# Patient Record
Sex: Female | Born: 1990 | Race: Black or African American | Hispanic: No | Marital: Single | Smoking: Current every day smoker
Health system: Southern US, Community
[De-identification: ages and names within clinical notes are randomized; demographics above are authoritative.]

## PROBLEM LIST (undated history)

## (undated) DIAGNOSIS — D649 Anemia, unspecified: Secondary | ICD-10-CM

---

## 2013-11-10 ENCOUNTER — Emergency Department (HOSPITAL_BASED_OUTPATIENT_CLINIC_OR_DEPARTMENT_OTHER): Payer: Self-pay

## 2013-11-10 ENCOUNTER — Emergency Department (HOSPITAL_BASED_OUTPATIENT_CLINIC_OR_DEPARTMENT_OTHER)
Admission: EM | Admit: 2013-11-10 | Discharge: 2013-11-11 | Disposition: A | Discharge status: Home / Self Care | Payer: Self-pay | Attending: Emergency Medicine | Admitting: Emergency Medicine

## 2013-11-10 ENCOUNTER — Encounter (HOSPITAL_BASED_OUTPATIENT_CLINIC_OR_DEPARTMENT_OTHER): Payer: Self-pay | Admitting: Emergency Medicine

## 2013-11-10 DIAGNOSIS — J159 Unspecified bacterial pneumonia: Secondary | ICD-10-CM | POA: Insufficient documentation

## 2013-11-10 DIAGNOSIS — R0789 Other chest pain: Secondary | ICD-10-CM

## 2013-11-10 DIAGNOSIS — Z862 Personal history of diseases of the blood and blood-forming organs and certain disorders involving the immune mechanism: Secondary | ICD-10-CM | POA: Insufficient documentation

## 2013-11-10 DIAGNOSIS — F17200 Nicotine dependence, unspecified, uncomplicated: Secondary | ICD-10-CM | POA: Insufficient documentation

## 2013-11-10 DIAGNOSIS — J189 Pneumonia, unspecified organism: Secondary | ICD-10-CM

## 2013-11-10 DIAGNOSIS — Z3202 Encounter for pregnancy test, result negative: Secondary | ICD-10-CM | POA: Insufficient documentation

## 2013-11-10 HISTORY — DX: Anemia, unspecified: D64.9

## 2013-11-10 MED ORDER — HYDROCODONE-ACETAMINOPHEN 5-325 MG PO TABS
1.0000 | ORAL_TABLET | Freq: Once | ORAL | Status: AC
  Administered 2013-11-10: 1 via ORAL
  Filled 2013-11-10: qty 1

## 2013-11-10 MED ORDER — NAPROXEN 250 MG PO TABS
500.0000 mg | ORAL_TABLET | Freq: Once | ORAL | Status: AC
  Administered 2013-11-10: 500 mg via ORAL
  Filled 2013-11-10: qty 2

## 2013-11-10 NOTE — ED Notes (Signed)
Pt complains of nasal congestion, shortness of breath, fever, cough, chest pain on inspiration since last week.

## 2013-11-10 NOTE — ED Provider Notes (Signed)
CSN: [id number]     Arrival date & time 11/10/13  2201 History  This chart was scribed for Hanley Seamen, MD by Danella Maiers, ED Scribe. This patient was seen in room MH06/MH06 and the patient's care was started at 11:25 PM.    Chief Complaint  Patient presents with  . URI   The history is provided by the patient. No language interpreter was used.   HPI Comments: Moana Munford is a 23 y.o. female who presents to the Emergency Department complaining of sharp left-sided chest pain under the left breast onset 3 hours ago. She states the pain worsens when she takes deep breaths, coughs, or sneezes. She rates it a 10/10. Pt also reports URI symptoms over the last week including nasal congestion, SOB, subjective fever and non-productive cough. She states all of her URI symptoms are improving except for the cough. She denies vomiting or diarrhea.   Past Medical History  Diagnosis Date  . Anemia    History reviewed. No pertinent past surgical history. History reviewed. No pertinent family history. History  Substance Use Topics  . Smoking status: Current Every Day Smoker  . Smokeless tobacco: Never Used  . Alcohol Use: No   OB History   Grav Para Term Preterm Abortions TAB SAB Ect Mult Living                 Review of Systems  Constitutional: Positive for fever.  HENT: Positive for congestion.   Respiratory: Positive for cough and shortness of breath.   Cardiovascular: Positive for chest pain.  Gastrointestinal: Negative for vomiting and diarrhea.   A complete 10 system review of systems was obtained and all systems are negative except as noted in the HPI and PMH.   Allergies  Review of patient's allergies indicates no known allergies.  Home Medications  No current outpatient prescriptions on file. BP 122/61  Pulse 90  Temp(Src) 98.7 F (37.1 C) (Oral)  Resp 22  Ht 5\' 11"  (1.803 m)  Wt 174 lb (78.926 kg)  BMI 24.28 kg/m2  SpO2 100%  LMP 11/02/2013  Physical Exam General:  Well-developed, well-nourished female in no acute distress; appearance consistent with age of record HENT: normocephalic; atraumatic. TMs normal. Throat normal.  Eyes: pupils equal, round and reactive to light; extraocular muscles intact Neck: supple Heart: regular rate and rhythm; no murmurs, rubs or gallops Lungs: clear to auscultation bilaterally. Left parasternal tenderness. Lungs are clear but shallow breathing due to pain.  Abdomen: soft; nondistended; nontender; no masses or hepatosplenomegaly; bowel sounds present.  Extremities: No deformity; full range of motion; pulses normal; no edema.  Neurologic: Awake, alert and oriented; motor function intact in all extremities and symmetric; no facial droop Skin: Warm and dry Psychiatric: Normal mood and affect  ED Course  Procedures (including critical care time) DIAGNOSTIC STUDIES: Oxygen Saturation is 100% on RA, normal by my interpretation.    COORDINATION OF CARE: 11:30 PM- Discussed treatment plan with pt which includes CXR. Pt agrees to plan.    MDM   Nursing notes and vitals signs, including pulse oximetry, reviewed.  Summary of this visit's results, reviewed by myself:  Imaging Studies: Dg Chest 2 View  11/11/2013   CLINICAL DATA:  Left-sided chest pain, worse with deep inspiration.  EXAM: CHEST  2 VIEW  COMPARISON:  None.  FINDINGS: The patient has bibasilar airspace disease with an appearance most worrisome for pneumonia. No pneumothorax or pleural fluid is identified. Heart size is normal.  IMPRESSION: Bibasilar  airspace disease worrisome for pneumonia.   Electronically Signed   By: Drusilla Kannerhomas  Dalessio M.D.   On: 11/11/2013 00:12   2:12 AM She given Rocephin 1 g IV. We will discharge her home on doxycycline. She was advised to return should symptoms worsen.  I personally performed the services described in this documentation, which was scribed in my presence.  The recorded information has been reviewed and is  accurate.   Hanley SeamenJohn L Lyanna Blystone, MD 11/11/13 [phone]

## 2013-11-11 LAB — PREGNANCY, URINE: PREG TEST UR: NEGATIVE

## 2013-11-11 MED ORDER — DOXYCYCLINE HYCLATE 100 MG PO TABS
100.0000 mg | ORAL_TABLET | Freq: Once | ORAL | Status: AC
  Administered 2013-11-11: 100 mg via ORAL

## 2013-11-11 MED ORDER — HYDROCODONE-ACETAMINOPHEN 5-325 MG PO TABS: 1.0000 | ORAL_TABLET | Freq: Four times a day (QID) | ORAL | Status: DC | PRN

## 2013-11-11 MED ORDER — DOXYCYCLINE HYCLATE 100 MG PO CAPS: ORAL_CAPSULE | ORAL | Status: DC

## 2013-11-11 MED ORDER — DEXTROSE 5 % IV SOLN
1.0000 g | Freq: Once | INTRAVENOUS | Status: AC
  Administered 2013-11-11: 1 g via INTRAVENOUS

## 2013-11-11 MED ORDER — SODIUM CHLORIDE 0.9 % IV SOLN
INTRAVENOUS | Status: DC
  Administered 2013-11-11: 01:00:00 via INTRAVENOUS

## 2013-11-11 MED ORDER — CEFTRIAXONE SODIUM 1 G IJ SOLR
INTRAMUSCULAR | Status: AC
  Filled 2013-11-11: qty 10

## 2013-11-11 MED ORDER — DOXYCYCLINE HYCLATE 100 MG PO TABS
ORAL_TABLET | ORAL | Status: AC
  Administered 2013-11-11: 03:00:00 100 mg via ORAL
  Filled 2013-11-11: qty 1

## 2013-11-11 NOTE — ED Notes (Signed)
Patient called requesting new RX for antibiotic called in due to expense of doxycycline. Spoke with Dr. Jodi MourningZavitz and z-pack called to [facility] [address]

## 2013-11-11 NOTE — ED Notes (Signed)
No adverse effects noted to IV abt infusion.

## 2015-08-19 ENCOUNTER — Encounter (HOSPITAL_BASED_OUTPATIENT_CLINIC_OR_DEPARTMENT_OTHER): Payer: Self-pay | Admitting: Emergency Medicine

## 2015-08-19 ENCOUNTER — Emergency Department (HOSPITAL_BASED_OUTPATIENT_CLINIC_OR_DEPARTMENT_OTHER)
Admission: EM | Admit: 2015-08-19 | Discharge: 2015-08-19 | Disposition: A | Discharge status: Home / Self Care | Payer: Medicaid Other | Attending: Emergency Medicine | Admitting: Emergency Medicine

## 2015-08-19 DIAGNOSIS — B360 Pityriasis versicolor: Secondary | ICD-10-CM | POA: Insufficient documentation

## 2015-08-19 DIAGNOSIS — Z862 Personal history of diseases of the blood and blood-forming organs and certain disorders involving the immune mechanism: Secondary | ICD-10-CM | POA: Insufficient documentation

## 2015-08-19 DIAGNOSIS — Z720 Tobacco use: Secondary | ICD-10-CM | POA: Insufficient documentation

## 2015-08-19 DIAGNOSIS — R21 Rash and other nonspecific skin eruption: Secondary | ICD-10-CM | POA: Diagnosis present

## 2015-08-19 MED ORDER — FLUCONAZOLE 200 MG PO TABS: 200.0000 mg | ORAL_TABLET | Freq: Every day | ORAL | Status: AC

## 2015-08-19 MED ORDER — KETOCONAZOLE 2 % EX SHAM: 1.0000 "application " | MEDICATED_SHAMPOO | CUTANEOUS | Status: AC

## 2015-08-19 NOTE — Discharge Instructions (Signed)
Tinea Versicolor Tinea versicolor is a common fungal infection of the skin. It causes a rash that appears as light or dark patches on the skin. The rash most often occurs on the chest, back, neck, or upper arms. This condition is more common during warm weather. Other than affecting how your skin looks, tinea versicolor usually does not cause other problems. In most cases, the infection goes away in a few weeks with treatment. It may take a few months for the patches on your skin to clear up. CAUSES Tinea versicolor occurs when a type of fungus that is normally present on the skin starts to overgrow. This fungus is a kind of yeast. The exact cause of the overgrowth is not known. This condition cannot be passed from one person to another (noncontagious). RISK FACTORS This condition is more likely to develop when certain factors are present, such as:  Heat and humidity.  Sweating too much.  Hormone changes.  Oily skin.  A weak defense (immune) system. SYMPTOMS Symptoms of this condition may include:  A rash on your skin that is made up of light or dark patches. The rash may have:  Patches of tan or pink spots on light skin.  Patches of white or brown spots on dark skin.  Patches of skin that do not tan.  Well-marked edges.  Scales on the discolored areas.  Mild itching. DIAGNOSIS A health care provider can usually diagnose this condition by looking at your skin. During the exam, he or she may use ultraviolet light to help determine the extent of the infection. In some cases, a skin sample may be taken by scraping the rash. This sample will be viewed under a microscope to check for yeast overgrowth. TREATMENT Treatment for this condition may include:  Dandruff shampoo that is applied to the affected skin during showers or bathing.  Over-the-counter medicated skin cream, lotion, or soaps.  Prescription antifungal medicine in the form of skin cream or pills.  Medicine to help  reduce itching. HOME CARE INSTRUCTIONS  Take medicines only as directed by your health care provider.  Apply dandruff shampoo to the affected area if told to do so by your health care provider. You may be instructed to scrub the affected skin for several minutes each day.  Do not scratch the affected area of skin.  Avoid hot and humid conditions.  Do not use tanning booths.  Try to avoid sweating a lot. SEEK MEDICAL CARE IF:  Your symptoms get worse.  You have a fever.  You have redness, swelling, or pain at the site of your rash.  You have fluid, blood, or pus coming from your rash.  Your rash returns after treatment.   This information is not intended to replace advice given to you by your health care provider. Make sure you discuss any questions you have with your health care provider.   Document Released: 10/16/2000 Document Revised: 11/09/2014 Document Reviewed: 07/31/2014 Elsevier Interactive Patient Education 2016 [employer]  

## 2015-08-19 NOTE — ED Provider Notes (Signed)
CSN: [id number]     Arrival date & time 08/19/15  0905 History   First MD Initiated Contact with Patient 08/19/15 [phone]     Chief Complaint  Patient presents with  . Rash     (Consider location/radiation/quality/duration/timing/severity/associated sxs/prior Treatment) Patient is a 24 y.o. female presenting with rash. The history is provided by the patient.  Rash Location:  Torso, shoulder/arm and head/neck Head/neck rash location:  L neck and R neck Shoulder/arm rash location:  L upper arm, R upper arm, L forearm and R forearm Torso rash location:  L chest, R chest, upper back, L axilla and R axilla (bilateral groin) Quality: dryness, itchiness and scaling   Quality: not painful   Severity:  Severe Onset quality:  Gradual Duration:  3 weeks Timing:  Constant Progression:  Worsening Chronicity:  New Context: not new detergent/soap and not sick contacts   Context comment:  Seen by [facility] proximally 2 weeks ago and was given steroids. After taking 4 days of steroid cigarettes got much worse Relieved by:  Nothing Exacerbated by: Steroids. Ineffective treatments:  None tried Associated symptoms: no abdominal pain, no myalgias, no nausea, no periorbital edema, no throat swelling, no tongue swelling and no URI     Past Medical History  Diagnosis Date  . Anemia    No past surgical history on file. No family history on file. Social History  Substance Use Topics  . Smoking status: Current Every Day Smoker  . Smokeless tobacco: Never Used  . Alcohol Use: No   OB History    No data available     Review of Systems  Gastrointestinal: Negative for nausea and abdominal pain.  Musculoskeletal: Negative for myalgias.  Skin: Positive for rash.  All other systems reviewed and are negative.     Allergies  Review of patient's allergies indicates no known allergies.  Home Medications   Prior to Admission medications   Medication Sig Start Date End Date Taking?  Authorizing Provider  fluconazole (DIFLUCAN) 200 MG tablet Take 1 tablet (200 mg total) by mouth daily. 08/19/15 08/26/15  Gwyneth SproutWhitney Cheyne Boulden, MD  ketoconazole (NIZORAL) 2 % shampoo Apply 1 application topically 2 (two) times a week. 08/19/15   Gwyneth SproutWhitney Hernandez Losasso, MD   BP 122/78 mmHg  Pulse 88  Temp(Src) 97.8 F (36.6 C) (Oral)  Resp 18  Ht 5' 11.5" (1.816 m)  Wt 183 lb (83.008 kg)  BMI 25.17 kg/m2  SpO2 100% Physical Exam  Constitutional: She is oriented to person, place, and time. She appears well-developed and well-nourished. No distress.  HENT:  Head: Normocephalic and atraumatic.  Mouth/Throat: Oropharynx is clear and moist.  Eyes: Conjunctivae and EOM are normal. Pupils are equal, round, and reactive to light.  Neck: Normal range of motion. Neck supple.  Cardiovascular: Normal rate.   Pulmonary/Chest: Effort normal.  Abdominal: Soft.  Musculoskeletal: Normal range of motion. She exhibits no edema or tenderness.  Neurological: She is alert and oriented to person, place, and time.  Skin: Skin is warm and dry. Rash noted. Rash is macular. No erythema.  Confluence rash most concentrated on the upper torso, upper extremities and groin. Circular raised dry patches with central clearing. Excoriated skin and dryness. No pustules or vesicular lesions no involvement of the lower legs or hands. No face involvement  Psychiatric: She has a normal mood and affect. Her behavior is normal.  Nursing note and vitals reviewed.   ED Course  Procedures (including critical care time) Labs Review Labs Reviewed -  No data to display  Imaging Review No results found. I have personally reviewed and evaluated these images and lab results as part of my medical decision-making.   EKG Interpretation None      MDM   Final diagnoses:  Tinea versicolor    Patient with a rash mostly involving the chest neck and upper back as well as upper extremities and groin most consistent with tinea. Patient  states that started 3 weeks ago when she was initially treated with prednisone which made her rash much worse causing her to stop the prednisone after 4 days.  Patient treated with heat, as all shampoo and Diflucan. Instructed to follow-up with dermatology if symptoms do not improve    Gwyneth Sprout, MD 08/19/15 1250

## 2015-08-19 NOTE — ED Notes (Signed)
Pt has generalized rash for two weeks.  Pt states it itches and burns.    Pt believed it was due to tide washing detergent.  Pt went to [facility] and was given steroids but the rash has worsened.   Pt states she has stopped all the detergents and soaps that were new.  Rash is flat, some redness and white areas noted.  Pt has tried several OTC ointments but nothing has helped.

## 2015-10-12 IMAGING — CR DG CHEST 2V
3 series · 3 of 3 positions shown · non-contrast
Comparison: None.

CLINICAL DATA: Left-sided chest pain, worse with deep inspiration.

EXAM:
CHEST  2 VIEW

[w chest pa (1 of 2)]
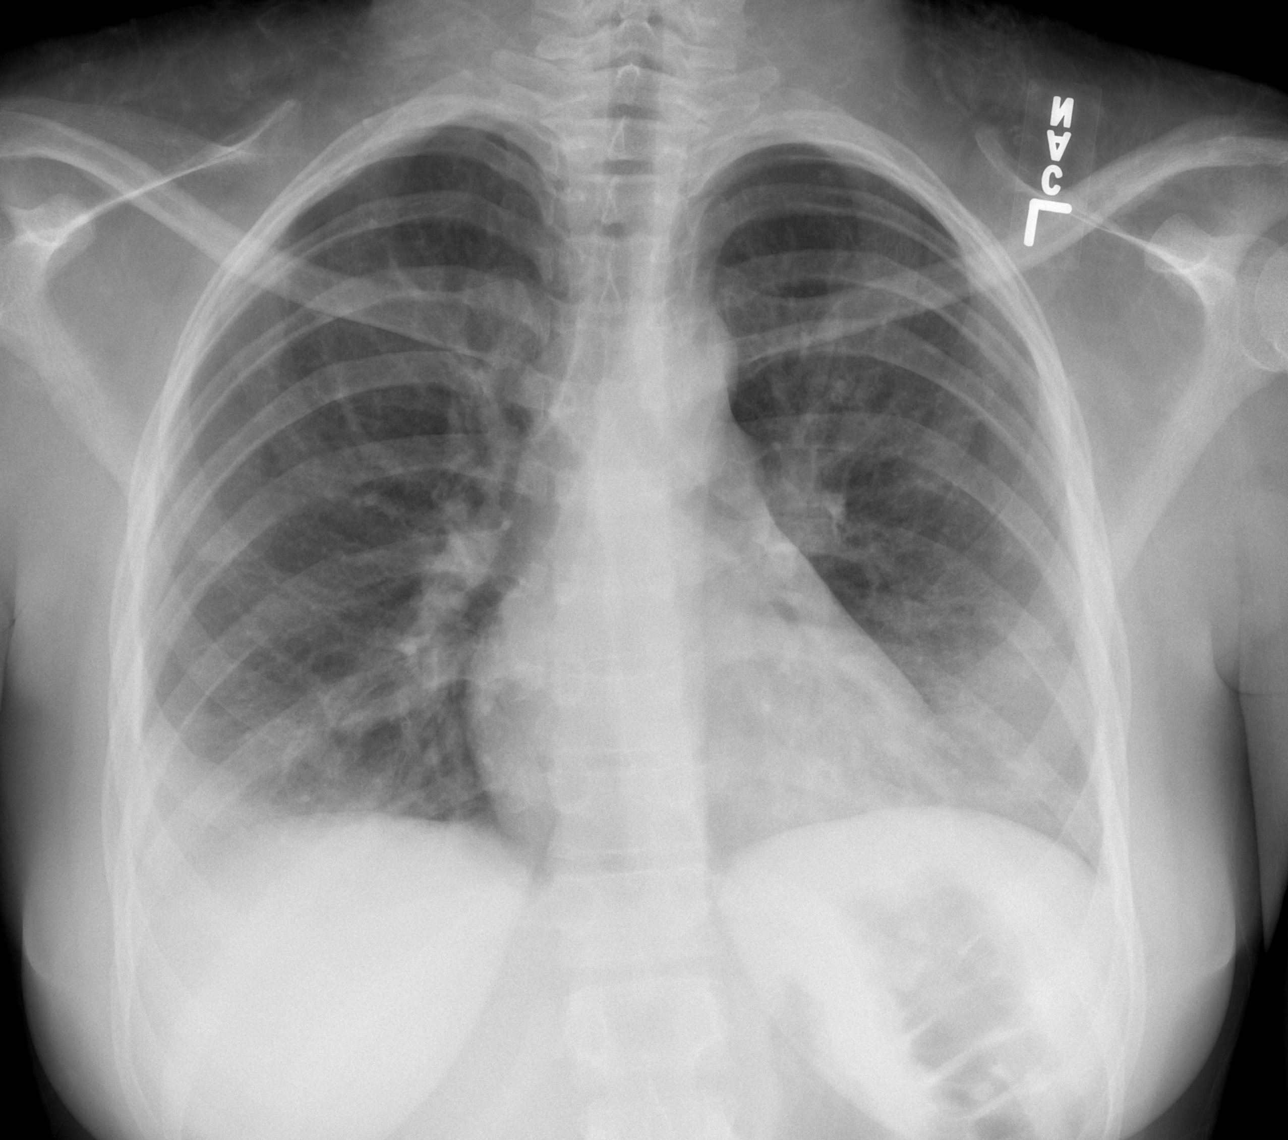

[w chest lat]
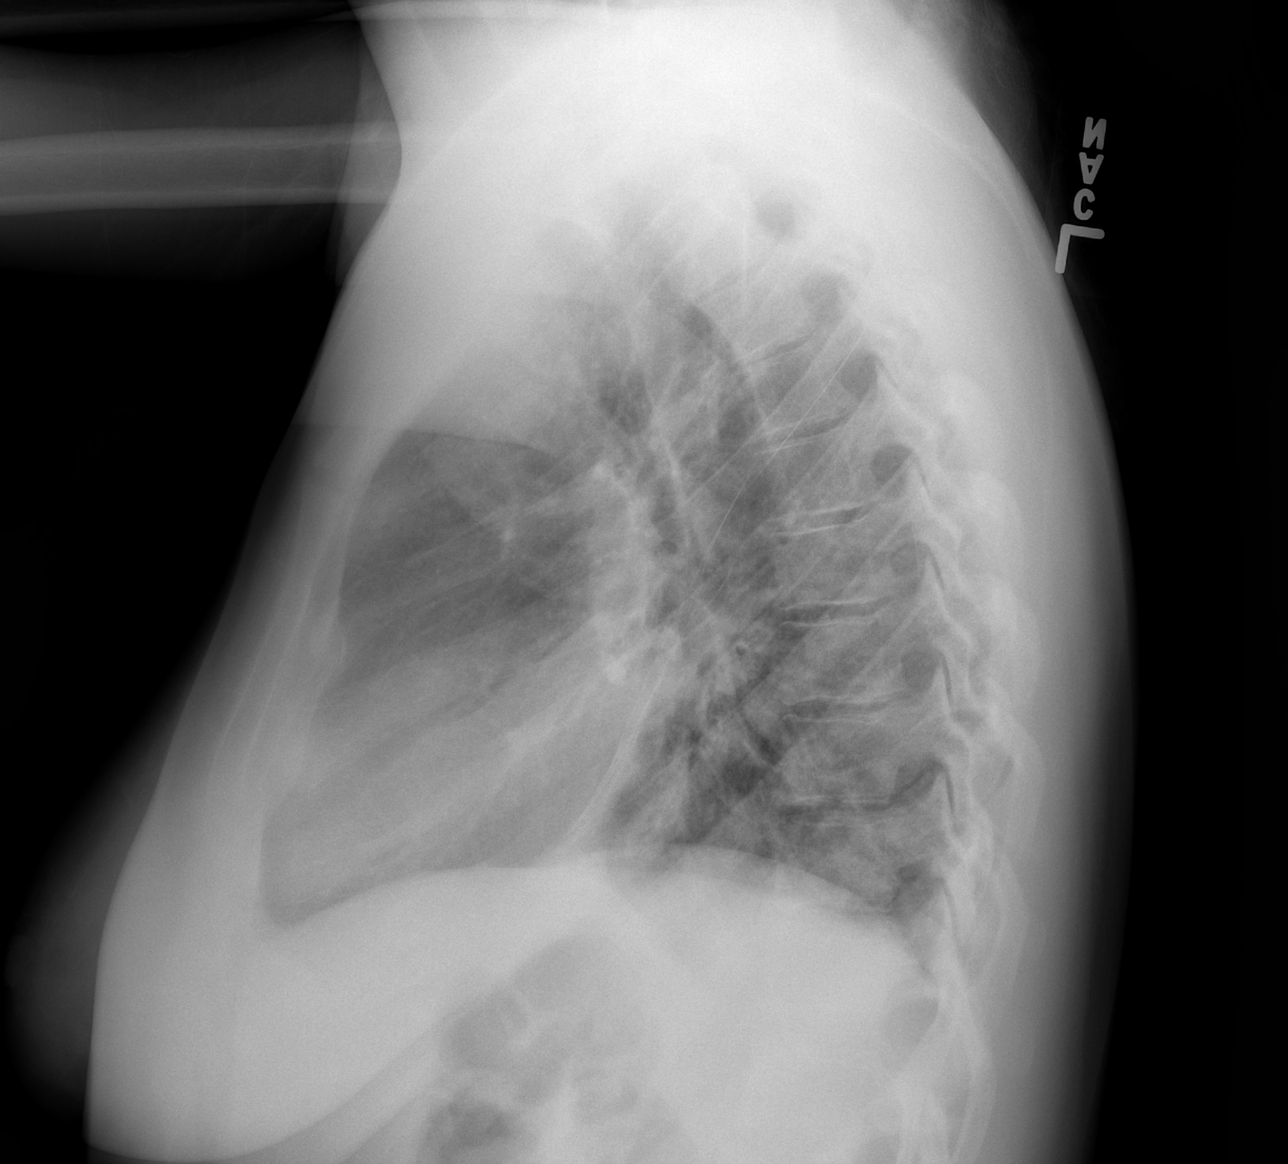

[w chest pa (2 of 2)]
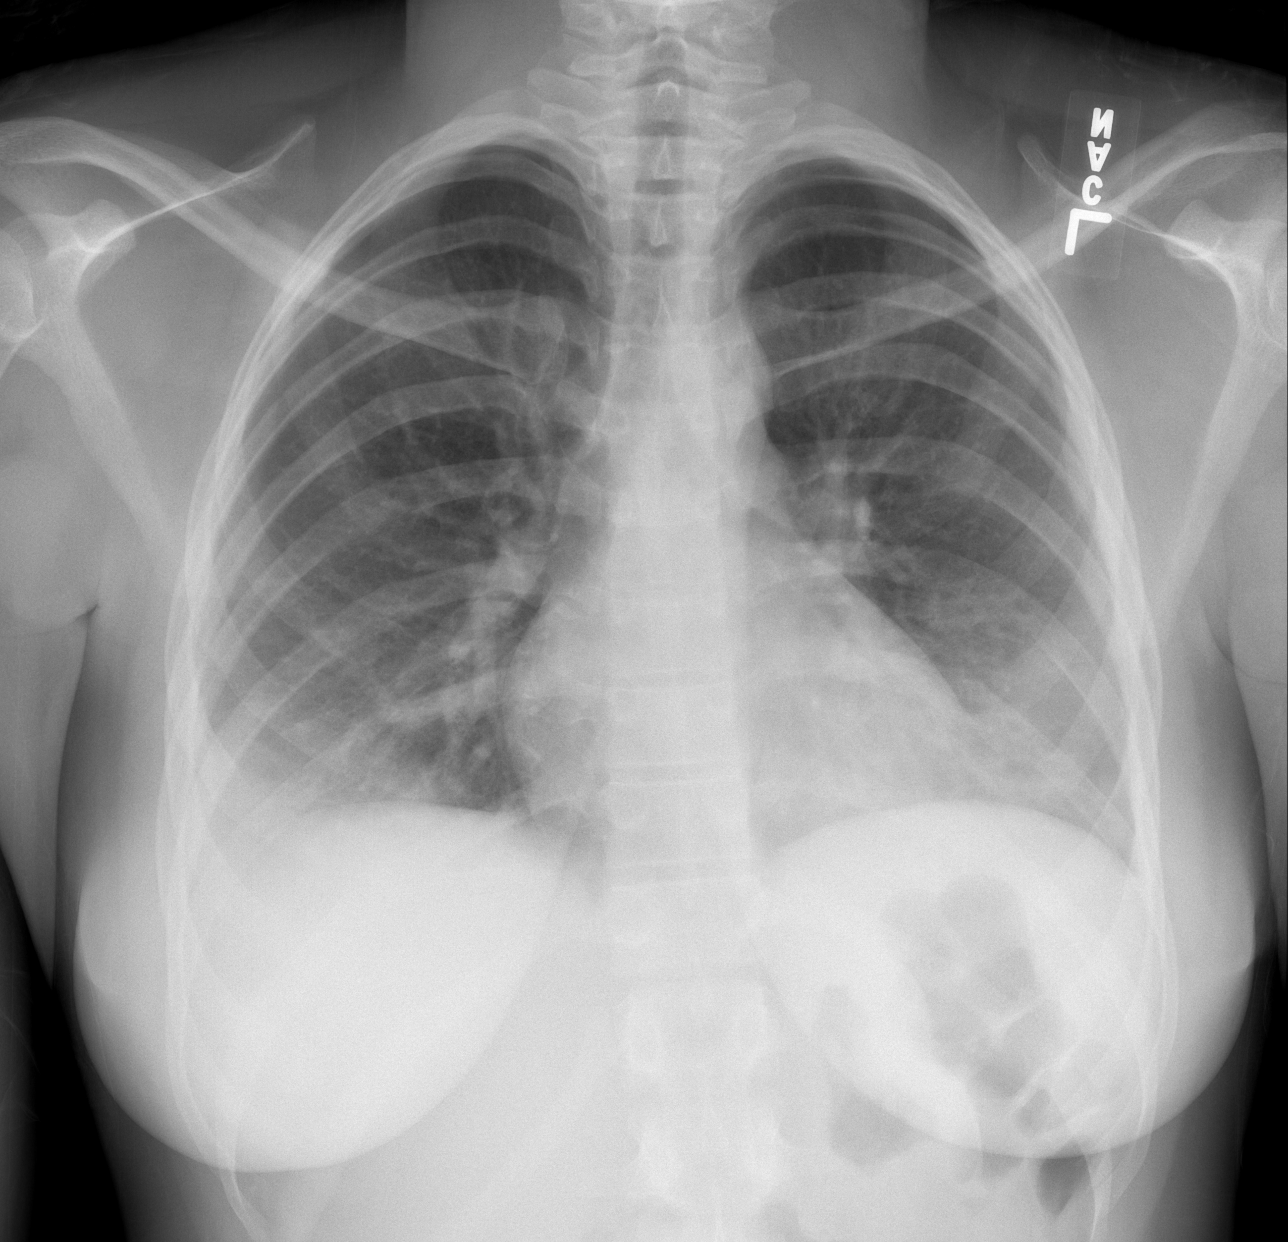

[3 of 3 positions shown; findings below may reference images not displayed]

FINDINGS: The patient has bibasilar airspace disease with an appearance most
worrisome for pneumonia. No pneumothorax or pleural fluid is
identified. Heart size is normal.
IMPRESSION: Bibasilar airspace disease worrisome for pneumonia.

## 2017-05-02 ENCOUNTER — Emergency Department (HOSPITAL_BASED_OUTPATIENT_CLINIC_OR_DEPARTMENT_OTHER): Payer: Medicaid Other

## 2017-05-02 ENCOUNTER — Encounter (HOSPITAL_BASED_OUTPATIENT_CLINIC_OR_DEPARTMENT_OTHER): Payer: Self-pay | Admitting: *Deleted

## 2017-05-02 ENCOUNTER — Emergency Department (HOSPITAL_BASED_OUTPATIENT_CLINIC_OR_DEPARTMENT_OTHER)
Admission: EM | Admit: 2017-05-02 | Discharge: 2017-05-02 | Disposition: A | Discharge status: Home / Self Care | Payer: Medicaid Other | Attending: Emergency Medicine | Admitting: Emergency Medicine

## 2017-05-02 DIAGNOSIS — R600 Localized edema: Secondary | ICD-10-CM | POA: Diagnosis not present

## 2017-05-02 DIAGNOSIS — F17200 Nicotine dependence, unspecified, uncomplicated: Secondary | ICD-10-CM | POA: Diagnosis not present

## 2017-05-02 DIAGNOSIS — R609 Edema, unspecified: Secondary | ICD-10-CM

## 2017-05-02 DIAGNOSIS — R2243 Localized swelling, mass and lump, lower limb, bilateral: Secondary | ICD-10-CM | POA: Diagnosis present

## 2017-05-02 LAB — URINALYSIS, MICROSCOPIC (REFLEX)

## 2017-05-02 LAB — URINALYSIS, ROUTINE W REFLEX MICROSCOPIC
Bilirubin Urine: NEGATIVE
GLUCOSE, UA: NEGATIVE mg/dL
Hgb urine dipstick: NEGATIVE
KETONES UR: NEGATIVE mg/dL
Nitrite: NEGATIVE
PH: 6.5 (ref 5.0–8.0)
Protein, ur: NEGATIVE mg/dL
SPECIFIC GRAVITY, URINE: 1.026 (ref 1.005–1.030)

## 2017-05-02 LAB — PREGNANCY, URINE: Preg Test, Ur: NEGATIVE

## 2017-05-02 NOTE — ED Provider Notes (Signed)
[facility] Note   CSN: [id number] Arrival date & time: 05/02/17  1930  By signing my name below, I, Diona BrownerJennifer Gorman, attest that this documentation has been prepared under the direction and in the presence of Alvira MondaySchlossman, Liza Czerwinski, MD. Electronically Signed: Diona BrownerJennifer Gorman, ED Scribe. 05/02/17. 9:32 PM.  History   Chief Complaint Chief Complaint  Patient presents with  . Leg Swelling    HPI Penny Hoffman is a 26 y.o. female who presents to the Emergency Department complaining of gradually worsening bilateral leg edema for the last week, L>R. Associated sx include weakness and mid back pain. Left leg pain with ambulating. No hx of leg swelling. Has Nexplanon in place for the last 1-1.5 years. She has intermittent periods. Last menstrual period was last month. Pt denies CP, SOB, and abdominal pain.  The history is provided by the patient. No language interpreter was used.    Past Medical History:  Diagnosis Date  . Anemia     There are no active problems to display for this patient.   History reviewed. No pertinent surgical history.  OB History    No data available       Home Medications    Prior to Admission medications   Medication Sig Start Date End Date Taking? Authorizing Provider  ketoconazole (NIZORAL) 2 % shampoo Apply 1 application topically 2 (two) times a week. 08/19/15   Gwyneth SproutPlunkett, Whitney, MD    Family History No family history on file.  Social History Social History  Substance Use Topics  . Smoking status: Current Every Day Smoker  . Smokeless tobacco: Never Used  . Alcohol use No     Allergies   Patient has no known allergies.   Review of Systems Review of Systems  Constitutional: Negative for fever.  HENT: Negative for sore throat.   Eyes: Negative for visual disturbance.  Respiratory: Negative for cough and shortness of breath.   Cardiovascular: Positive for leg swelling. Negative for chest pain.  Gastrointestinal:  Negative for abdominal pain.  Genitourinary: Negative for difficulty urinating.  Musculoskeletal: Positive for arthralgias, joint swelling and myalgias. Negative for back pain and neck pain.  Skin: Negative for rash.  Neurological: Negative for syncope and headaches.     Physical Exam Updated Vital Signs BP 115/61 (BP Location: Right Arm)   Pulse 72   Temp 98 F (36.7 C) (Oral)   Resp 17   Ht 5\' 11"  (1.803 m)   Wt 84.8 kg (187 lb)   SpO2 100%   BMI 26.08 kg/m   Physical Exam  Constitutional: She is oriented to person, place, and time. She appears well-developed and well-nourished. No distress.  HENT:  Head: Normocephalic and atraumatic.  Eyes: Conjunctivae and EOM are normal.  Neck: Normal range of motion.  Cardiovascular: Normal rate, regular rhythm, normal heart sounds and intact distal pulses.  Exam reveals no gallop and no friction rub.   No murmur heard. Pulmonary/Chest: Effort normal and breath sounds normal. No respiratory distress. She has no wheezes. She has no rales.  Abdominal: Soft. She exhibits no distension. There is no tenderness. There is no guarding.  Musculoskeletal: She exhibits no edema or tenderness.  Lumbar spine tenderness. Left sided CVA tenderness.  Neurological: She is alert and oriented to person, place, and time.  Skin: Skin is warm and dry. No rash noted. She is not diaphoretic. No erythema.  Nursing note and vitals reviewed.    ED Treatments / Results  DIAGNOSTIC STUDIES: Oxygen Saturation is 100%  on RA, normal by my interpretation.   COORDINATION OF CARE: 9:32 PM-Discussed next steps with pt which includes a U/A. Pt is to elevate her legs when she is able to and following up with her PCP. If sx change or worsen she is to return for reevaluation. Pt verbalized understanding and is agreeable with the plan.   Labs (all labs ordered are listed, but only abnormal results are displayed) Labs Reviewed  URINALYSIS, ROUTINE W REFLEX MICROSCOPIC  - Abnormal; Notable for the following:       Result Value   APPearance CLOUDY (*)    Leukocytes, UA SMALL (*)    All other components within normal limits  URINALYSIS, MICROSCOPIC (REFLEX) - Abnormal; Notable for the following:    Bacteria, UA MANY (*)    Squamous Epithelial / LPF 6-30 (*)    All other components within normal limits  PREGNANCY, URINE    EKG  EKG Interpretation None       Radiology US Venous Img Lower Unilateral Left  Result Date: 05/02/2017 CLINICAL DATA:  Initial evaluation for bilateral ankle weakness, pain, swelling, left greater than right. EXAM: Left LOWER EXTREMITY VENOUS DOPPLER ULTRASOUND TECHNIQUE: Gray-scale sonography with graded compression, as well as color Doppler and duplex ultrasound were performed to evaluate the lower extremity deep venous systems from the level of the common femoral vein and including the common femoral, femoral, profunda femoral, popliteal and calf veins including the posterior tibial, peroneal and gastrocnemius veins when visible. The superficial great saphenous vein was also interrogated. Spectral Doppler was utilized to evaluate flow at rest and with distal augmentation maneuvers in the common femoral, femoral and popliteal veins. COMPARISON:  None. FINDINGS: Contralateral Common Femoral Vein: Respiratory phasicity is normal and symmetric with the symptomatic side. No evidence of thrombus. Normal compressibility. Common Femoral Vein: No evidence of thrombus. Normal compressibility, respiratory phasicity and response to augmentation. Saphenofemoral Junction: No evidence of thrombus. Normal compressibility and flow on color Doppler imaging. Profunda Femoral Vein: No evidence of thrombus. Normal compressibility and flow on color Doppler imaging. Femoral Vein: No evidence of thrombus. Normal compressibility, respiratory phasicity and response to augmentation. Popliteal Vein: No evidence of thrombus. Normal compressibility, respiratory  phasicity and response to augmentation. Calf Veins: No evidence of thrombus. Normal compressibility and flow on color Doppler imaging. Superficial Great Saphenous Vein: No evidence of thrombus. Normal compressibility and flow on color Doppler imaging. Venous Reflux:  None. Other Findings:  None. IMPRESSION: No evidence of DVT within the left lower extremity. Electronically Signed   By: Rise Mu M.D.   On: 05/02/2017 20:21    Procedures Procedures (including critical care time)  Medications Ordered in ED Medications - No data to display   Initial Impression / Assessment and Plan / ED Course  I have reviewed the triage vital signs and the nursing notes.  Pertinent labs & imaging results that were available during my care of the patient were reviewed by me and considered in my medical decision making (see chart for details).      26yo female presents with concern for lower extremity swelling, left worse than right.  DVT study negative.  Urinalysis without proteinuria. No dyspnea, doubt acute CHF exacerbation. Recommend continued outpatient evaluation/labs. Patient discharged in stable condition with understanding of reasons to return.    I personally performed the services described in this documentation, which was scribed in my presence. The recorded information has been reviewed and is accurate.   Final Clinical Impressions(s) / ED Diagnoses  Final diagnoses:  Peripheral edema    New Prescriptions Discharge Medication List as of 05/02/2017 10:17 PM       Alvira Monday, MD 05/04/17 1239

## 2017-05-02 NOTE — ED Triage Notes (Signed)
Pt reports that for about 1 week she has had bilateral leg swelling, however the left ankle/foot/leg is more swollen and causing her more pain than the right. She has felt fatigued for several months. Denies CP or SOB

## 2017-05-02 NOTE — ED Notes (Signed)
Patient transported to Ultrasound 

## 2018-08-12 IMAGING — US US EXTREM LOW VENOUS*L*
1 series · 13 of 24 positions shown · non-contrast
Comparison: None.

CLINICAL DATA: Initial evaluation for bilateral ankle weakness,
pain, swelling, left greater than right.



[Series 1: us extrem low venous*left* · 0.08mm/px · 13 of 27 slices shown]
[im 1/27]
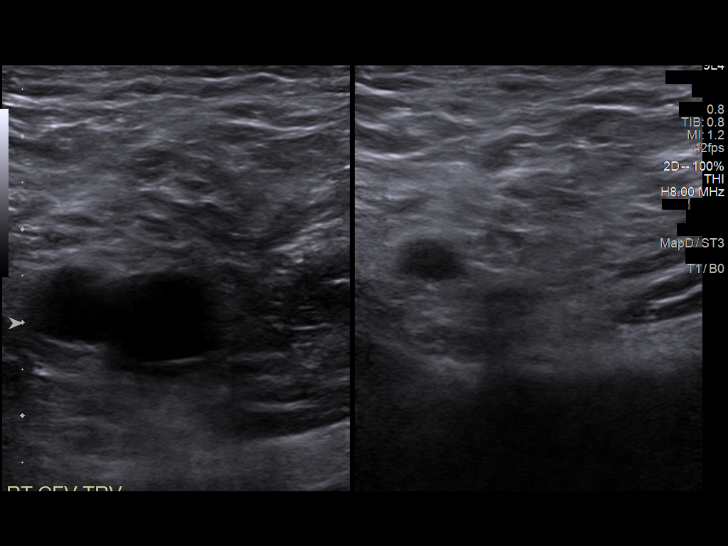
[im 3/27]
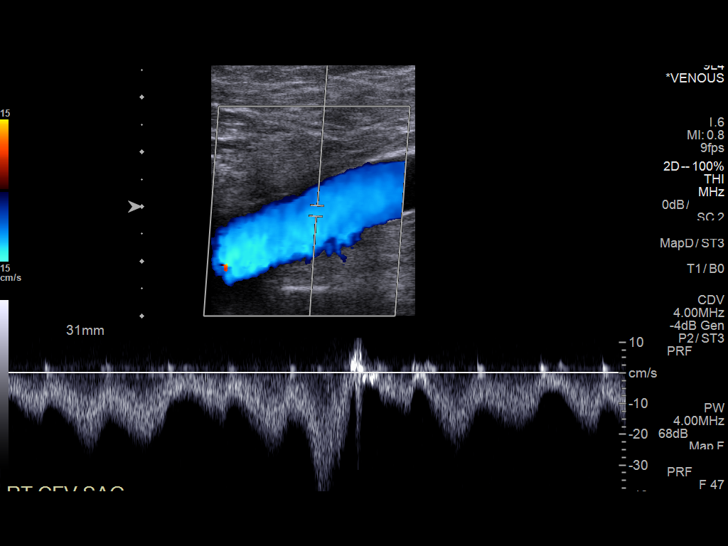
[im 5/27]
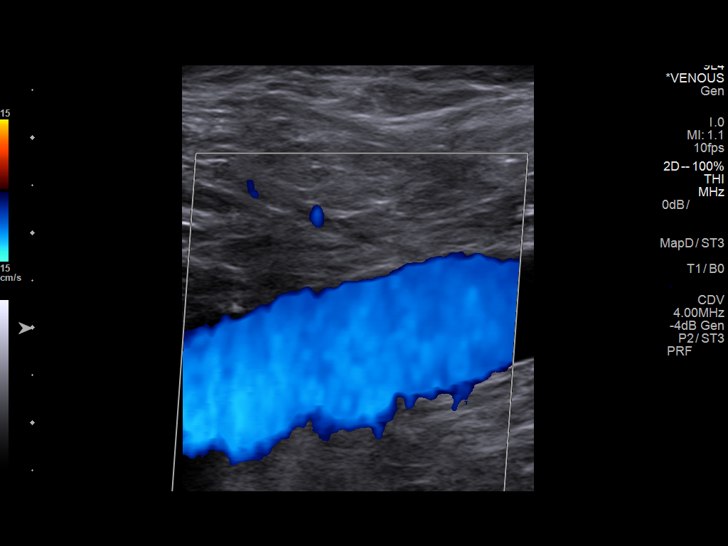
[im 7/27]
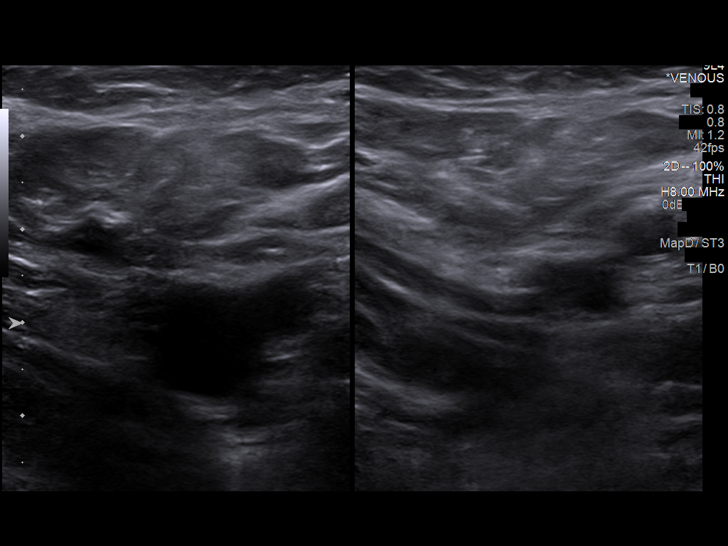
[im 10/27]
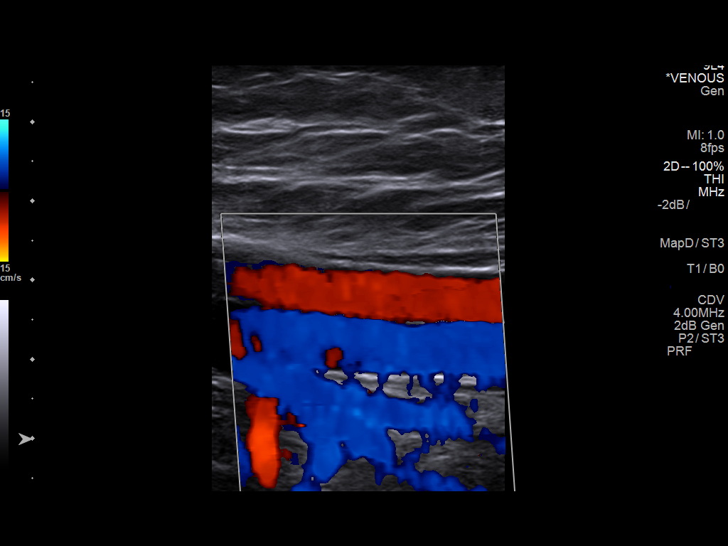
[im 12/27]
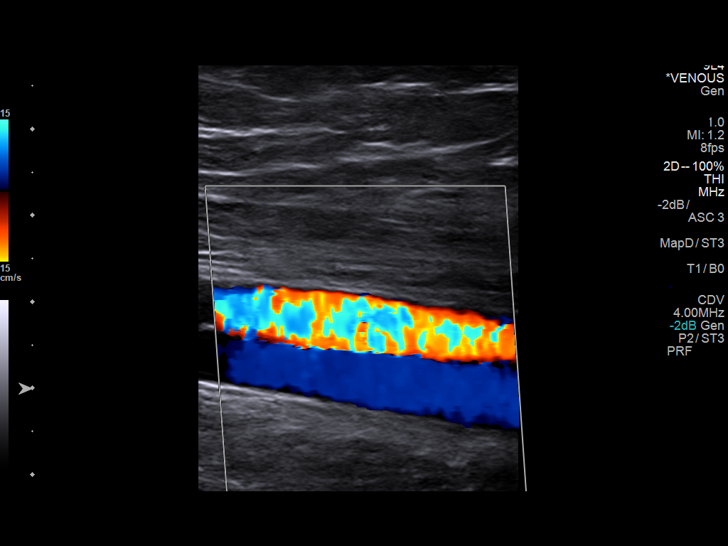
[im 14/27]
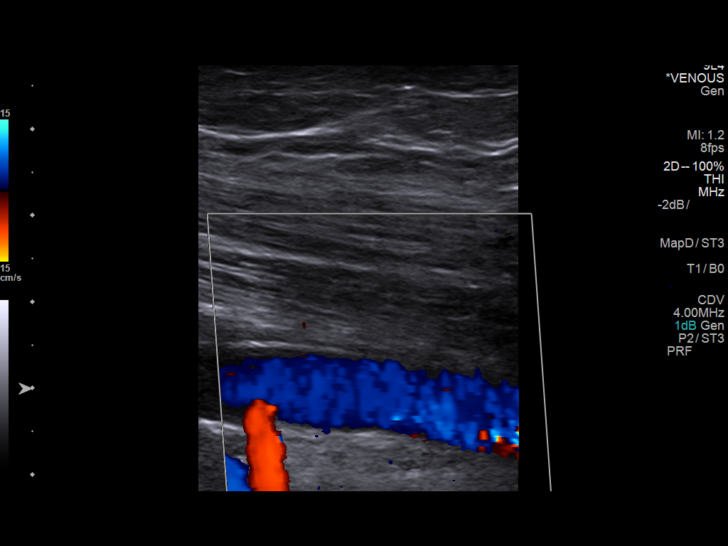
[im 15/27]
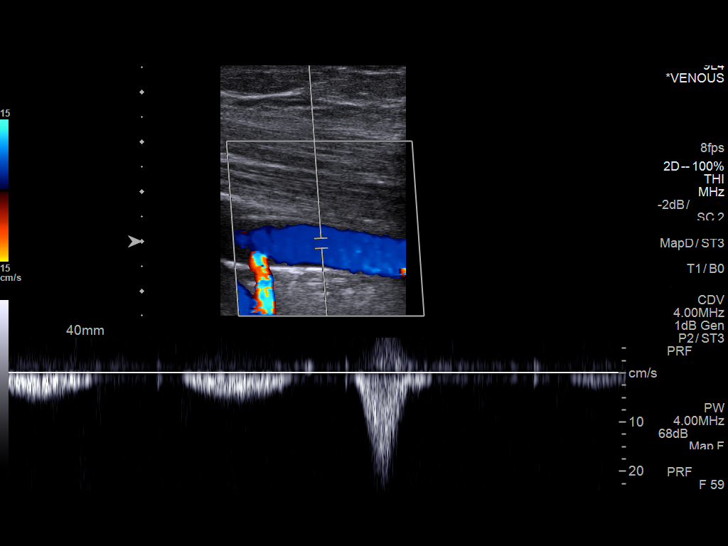
[im 17/27]
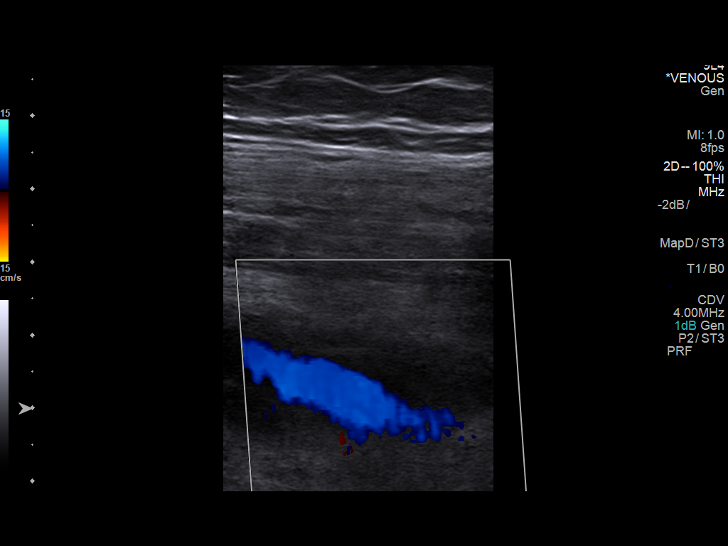
[im 20/27]
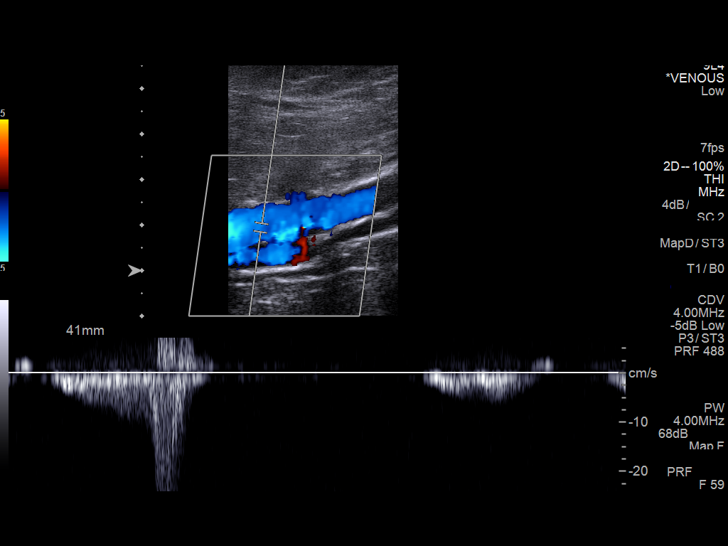
[im 22/27]
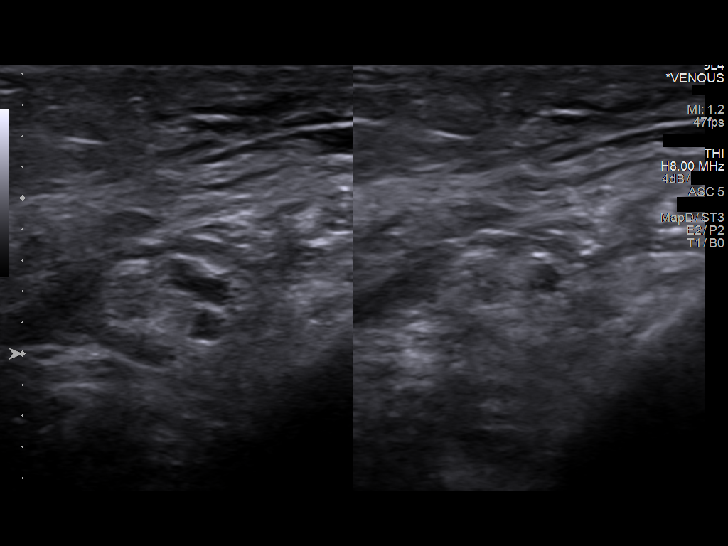
[im 24/27]
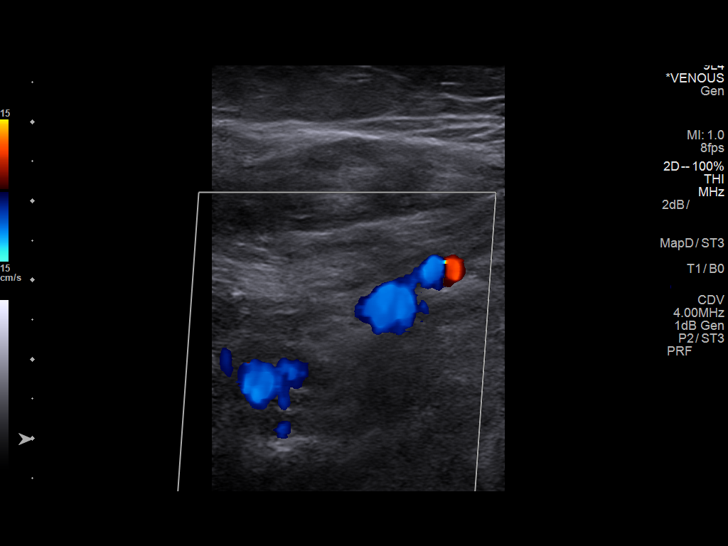
[im 27/27]
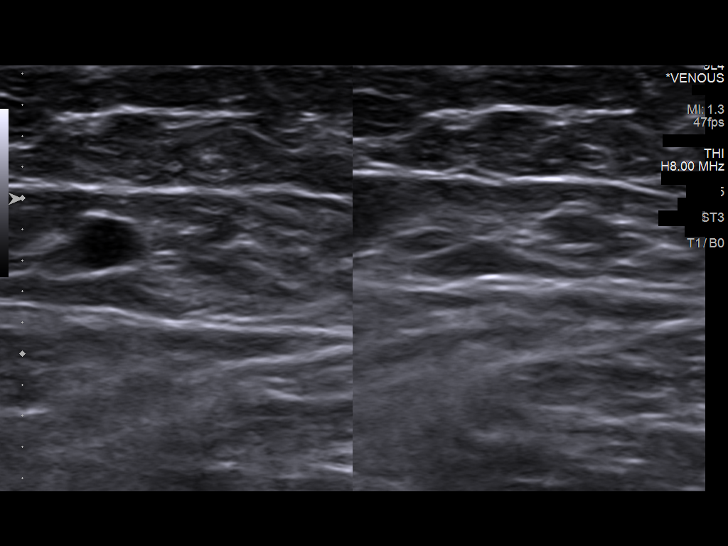

[13 of 24 positions shown; findings below may reference images not displayed]

FINDINGS: Contralateral Common Femoral Vein: Respiratory phasicity is normal
and symmetric with the symptomatic side. No evidence of thrombus.
Normal compressibility.

Common Femoral Vein: No evidence of thrombus. Normal
compressibility, respiratory phasicity and response to augmentation.

Saphenofemoral Junction: No evidence of thrombus. Normal
compressibility and flow on color Doppler imaging.

Profunda Femoral Vein: No evidence of thrombus. Normal
compressibility and flow on color Doppler imaging.

Femoral Vein: No evidence of thrombus. Normal compressibility,
respiratory phasicity and response to augmentation.

Popliteal Vein: No evidence of thrombus. Normal compressibility,
respiratory phasicity and response to augmentation.

Calf Veins: No evidence of thrombus. Normal compressibility and flow
on color Doppler imaging.

Superficial Great Saphenous Vein: No evidence of thrombus. Normal
compressibility and flow on color Doppler imaging.

Venous Reflux:  None.

Other Findings:  None.
IMPRESSION: No evidence of DVT within the left lower extremity.

## 2020-04-30 ENCOUNTER — Other Ambulatory Visit: Payer: Self-pay

## 2020-04-30 ENCOUNTER — Emergency Department (HOSPITAL_BASED_OUTPATIENT_CLINIC_OR_DEPARTMENT_OTHER)
Admission: EM | Admit: 2020-04-30 | Discharge: 2020-04-30 | Disposition: A | Discharge status: Home / Self Care | Payer: Medicaid Other | Attending: Emergency Medicine | Admitting: Emergency Medicine

## 2020-04-30 ENCOUNTER — Encounter (HOSPITAL_BASED_OUTPATIENT_CLINIC_OR_DEPARTMENT_OTHER): Payer: Self-pay | Admitting: Emergency Medicine

## 2020-04-30 DIAGNOSIS — F17210 Nicotine dependence, cigarettes, uncomplicated: Secondary | ICD-10-CM | POA: Insufficient documentation

## 2020-04-30 DIAGNOSIS — H9313 Tinnitus, bilateral: Secondary | ICD-10-CM

## 2020-04-30 DIAGNOSIS — H9203 Otalgia, bilateral: Secondary | ICD-10-CM | POA: Insufficient documentation

## 2020-04-30 MED ORDER — LORATADINE 10 MG PO TABS
10.0000 mg | ORAL_TABLET | Freq: Once | ORAL | Status: AC
  Administered 2020-04-30: 04:00:00 10 mg via ORAL
  Filled 2020-04-30: qty 1

## 2020-04-30 MED ORDER — FLUTICASONE PROPIONATE 50 MCG/ACT NA SUSP: 2.0000 | Freq: Every day | NASAL | 0 refills | Status: AC

## 2020-04-30 MED ORDER — CETIRIZINE HCL 10 MG PO TABS: 10.0000 mg | ORAL_TABLET | Freq: Every day | ORAL | 0 refills | Status: AC

## 2020-04-30 NOTE — ED Provider Notes (Signed)
[facility] EMERGENCY DEPARTMENT Provider Note   CSN: [id number] Arrival date & time: 04/30/20  0045     History Chief Complaint  Patient presents with   Otalgia    Penny Hoffman is a 29 y.o. female.  The history is provided by the patient.  Otalgia Location:  Bilateral Behind ear:  No abnormality Quality:  Aching (and has ringing in B ears as well ) Severity:  Moderate Onset quality:  Gradual Duration:  3 days Timing:  Constant Progression:  Unchanged Chronicity:  New Context: not direct blow and not elevation change   Relieved by:  Nothing Worsened by:  Nothing Ineffective treatments:  None tried Associated symptoms: tinnitus   Associated symptoms: no abdominal pain, no cough, no fever, no neck pain, no rash, no rhinorrhea, no sore throat and no vomiting   Risk factors: no recent travel        Past Medical History:  Diagnosis Date   Anemia     There are no problems to display for this patient.   History reviewed. No pertinent surgical history.   OB History   No obstetric history on file.     History reviewed. No pertinent family history.  Social History   Tobacco Use   Smoking status: Current Every Day Smoker   Smokeless tobacco: Never Used  Substance Use Topics   Alcohol use: No   Drug use: No    Home Medications Prior to Admission medications   Medication Sig Start Date End Date Taking? Authorizing Provider  ketoconazole (NIZORAL) 2 % shampoo Apply 1 application topically 2 (two) times a week. 08/19/15   Gwyneth Sprout, MD    Allergies    Patient has no known allergies.  Review of Systems   Review of Systems  Constitutional: Negative for fever.  HENT: Positive for ear pain and tinnitus. Negative for rhinorrhea and sore throat.   Eyes: Negative for visual disturbance.  Respiratory: Negative for cough.   Cardiovascular: Negative for chest pain.  Gastrointestinal: Negative for abdominal pain and vomiting.    Genitourinary: Negative for difficulty urinating.  Musculoskeletal: Negative for neck pain.  Skin: Negative for rash.  Neurological: Negative for dizziness.  Psychiatric/Behavioral: Negative for agitation.  All other systems reviewed and are negative.   Physical Exam Updated Vital Signs BP 116/82 (BP Location: Left Arm)    Pulse 66    Temp 98.2 F (36.8 C) (Oral)    Resp 18    Ht 5\' 11"  (1.803 m)    Wt 84 kg    SpO2 100%    BMI 25.83 kg/m   Physical Exam Vitals and nursing note reviewed.  Constitutional:      General: She is not in acute distress.    Appearance: Normal appearance.  HENT:     Head: Normocephalic and atraumatic.     Right Ear: Tympanic membrane, ear canal and external ear normal.     Left Ear: Tympanic membrane, ear canal and external ear normal.     Nose: Nose normal.  Eyes:     Conjunctiva/sclera: Conjunctivae normal.     Pupils: Pupils are equal, round, and reactive to light.  Cardiovascular:     Rate and Rhythm: Normal rate and regular rhythm.     Pulses: Normal pulses.     Heart sounds: Normal heart sounds.  Pulmonary:     Effort: Pulmonary effort is normal.     Breath sounds: Normal breath sounds.  Abdominal:     General: Abdomen is flat.  Bowel sounds are normal.     Tenderness: There is no abdominal tenderness. There is no guarding or rebound.  Musculoskeletal:        General: Normal range of motion.     Cervical back: Normal range of motion and neck supple.  Skin:    General: Skin is warm and dry.     Capillary Refill: Capillary refill takes less than 2 seconds.  Neurological:     General: No focal deficit present.     Mental Status: She is alert and oriented to person, place, and time.     Deep Tendon Reflexes: Reflexes normal.  Psychiatric:        Mood and Affect: Mood normal.        Behavior: Behavior normal.     ED Results / Procedures / Treatments   Labs (all labs ordered are listed, but only abnormal results are displayed) Labs  Reviewed - No data to display  EKG None  Radiology No results found.  Procedures Procedures (including critical care time)  Medications Ordered in ED Medications  loratadine (CLARITIN) tablet 10 mg (has no administration in time range)    ED Course  I have reviewed the triage vital signs and the nursing notes.  Pertinent labs & imaging results that were available during my care of the patient were reviewed by me and considered in my medical decision making (see chart for details).    Will start claritin and flonase as it may be a eustachian tube issue.  There are no signs of infection.  Will refer to ENT for ongoing issues.    Penny Hoffman was evaluated in Emergency Department on 04/30/2020 for the symptoms described in the history of present illness. She was evaluated in the context of the global COVID-19 pandemic, which necessitated consideration that the patient might be at risk for infection with the SARS-CoV-2 virus that causes COVID-19. Institutional protocols and algorithms that pertain to the evaluation of patients at risk for COVID-19 are in a state of rapid change based on information released by regulatory bodies including the CDC and federal and state organizations. These policies and algorithms were followed during the patient's care in the ED.   Final Clinical Impression(s) / ED Diagnoses Return for intractable cough, coughing up blood,fevers >100.4 unrelieved by medication, shortness of breath, intractable vomiting, chest pain, shortness of breath, weakness,numbness, changes in speech, facial asymmetry,abdominal pain, passing out,Inability to tolerate liquids or food, cough, altered mental status or any concerns. No signs of systemic illness or infection. The patient is nontoxic-appearing on exam and vital signs are within normal limits.   I have reviewed the triage vital signs and the nursing notes. Pertinent labs &imaging results that were available during my  care of the patient were reviewed by me and considered in my medical decision making (see chart for details).After history, exam, and medical workup I feel the patient has beenappropriately medically screened and is safe for discharge home. Pertinent diagnoses were discussed with the patient. Patient was given return precautions.   Penny Gubler, MD 04/30/20 2423

## 2020-04-30 NOTE — ED Triage Notes (Signed)
Patient presents with complaints of bilateral ear ringing and pain onset 3 days ago; denies any otc meds pta. NAD noted.

## 2024-11-03 ENCOUNTER — Emergency Department (HOSPITAL_BASED_OUTPATIENT_CLINIC_OR_DEPARTMENT_OTHER)
Admission: EM | Admit: 2024-11-03 | Discharge: 2024-11-03 | Discharge status: Against Medical Advice | Attending: Emergency Medicine | Admitting: Emergency Medicine

## 2024-11-03 ENCOUNTER — Other Ambulatory Visit: Payer: Self-pay

## 2024-11-03 ENCOUNTER — Encounter (HOSPITAL_BASED_OUTPATIENT_CLINIC_OR_DEPARTMENT_OTHER): Payer: Self-pay

## 2024-11-03 DIAGNOSIS — Z5321 Procedure and treatment not carried out due to patient leaving prior to being seen by health care provider: Secondary | ICD-10-CM | POA: Insufficient documentation

## 2024-11-03 DIAGNOSIS — R519 Headache, unspecified: Secondary | ICD-10-CM | POA: Insufficient documentation

## 2024-11-03 DIAGNOSIS — O99891 Other specified diseases and conditions complicating pregnancy: Secondary | ICD-10-CM | POA: Diagnosis not present

## 2024-11-03 DIAGNOSIS — O26892 Other specified pregnancy related conditions, second trimester: Secondary | ICD-10-CM | POA: Diagnosis present

## 2024-11-03 DIAGNOSIS — Z3A16 16 weeks gestation of pregnancy: Secondary | ICD-10-CM | POA: Diagnosis not present

## 2024-11-03 LAB — BASIC METABOLIC PANEL WITH GFR
Anion gap: 13 (ref 5–15)
BUN: 8 mg/dL (ref 6–20)
CO2: 21 mmol/L — ABNORMAL LOW (ref 22–32)
Calcium: 9.6 mg/dL (ref 8.9–10.3)
Chloride: 102 mmol/L (ref 98–111)
Creatinine, Ser: 0.48 mg/dL (ref 0.44–1.00)
GFR, Estimated: >60 mL/min
Glucose, Bld: 142 mg/dL — ABNORMAL HIGH (ref 70–99)
Potassium: 3.4 mmol/L — ABNORMAL LOW (ref 3.5–5.1)
Sodium: 136 mmol/L (ref 135–145)

## 2024-11-03 LAB — CBC
HCT: 29.6 % — ABNORMAL LOW (ref 36.0–46.0)
Hemoglobin: 10.4 g/dL — ABNORMAL LOW (ref 12.0–15.0)
MCH: 31.6 pg (ref 26.0–34.0)
MCHC: 35.1 g/dL (ref 30.0–36.0)
MCV: 90 fL (ref 80.0–100.0)
Platelets: 301 K/uL (ref 150–400)
RBC: 3.29 MIL/uL — ABNORMAL LOW (ref 3.87–5.11)
RDW: 13.2 % (ref 11.5–15.5)
WBC: 8.5 K/uL (ref 4.0–10.5)
nRBC: 0 % (ref 0.0–0.2)

## 2024-11-03 NOTE — ED Triage Notes (Signed)
 Pt arrives with c/o headache that started yesterday. Pt denies n/v. Pt is currently [redacted] weeks pregnant. Pt denies vaginal bleeding or discharge.
